# Patient Record
Sex: Female | Born: 1975 | Race: White | Hispanic: No | Marital: Married | Smoking: Current every day smoker
Health system: Southern US, Community
[De-identification: ages and names within clinical notes are randomized; demographics above are authoritative.]

## PROBLEM LIST (undated history)

## (undated) DIAGNOSIS — M549 Dorsalgia, unspecified: Secondary | ICD-10-CM

## (undated) HISTORY — PX: APPENDECTOMY: SHX54

---

## 2007-05-10 ENCOUNTER — Emergency Department (HOSPITAL_COMMUNITY): Admission: EM | Admit: 2007-05-10 | Discharge: 2007-05-10 | Payer: Self-pay | Admitting: Emergency Medicine

## 2007-05-26 ENCOUNTER — Emergency Department (HOSPITAL_COMMUNITY): Admission: EM | Admit: 2007-05-26 | Discharge: 2007-05-26 | Payer: Self-pay | Admitting: Emergency Medicine

## 2007-07-22 ENCOUNTER — Emergency Department (HOSPITAL_COMMUNITY): Admission: EM | Admit: 2007-07-22 | Discharge: 2007-07-22 | Payer: Self-pay | Admitting: Emergency Medicine

## 2007-07-29 ENCOUNTER — Emergency Department (HOSPITAL_COMMUNITY): Admission: EM | Admit: 2007-07-29 | Discharge: 2007-07-29 | Payer: Self-pay | Admitting: Emergency Medicine

## 2007-10-13 ENCOUNTER — Emergency Department (HOSPITAL_COMMUNITY): Admission: EM | Admit: 2007-10-13 | Discharge: 2007-10-13 | Payer: Self-pay | Admitting: Emergency Medicine

## 2007-11-19 ENCOUNTER — Emergency Department (HOSPITAL_COMMUNITY): Admission: EM | Admit: 2007-11-19 | Discharge: 2007-11-19 | Payer: Self-pay | Admitting: Emergency Medicine

## 2010-10-23 ENCOUNTER — Emergency Department (HOSPITAL_COMMUNITY)
Admission: EM | Admit: 2010-10-23 | Discharge: 2010-10-23 | Disposition: A | Discharge status: Home / Self Care | Payer: Self-pay | Attending: Emergency Medicine | Admitting: Emergency Medicine

## 2010-10-23 DIAGNOSIS — Z87440 Personal history of urinary (tract) infections: Secondary | ICD-10-CM | POA: Insufficient documentation

## 2010-10-23 DIAGNOSIS — M545 Low back pain, unspecified: Secondary | ICD-10-CM | POA: Insufficient documentation

## 2010-10-23 DIAGNOSIS — F341 Dysthymic disorder: Secondary | ICD-10-CM | POA: Insufficient documentation

## 2011-01-11 ENCOUNTER — Emergency Department (HOSPITAL_COMMUNITY)
Admission: EM | Admit: 2011-01-11 | Discharge: 2011-01-11 | Disposition: A | Discharge status: Home / Self Care | Payer: Medicaid Other | Attending: Emergency Medicine | Admitting: Emergency Medicine

## 2011-01-11 DIAGNOSIS — H53149 Visual discomfort, unspecified: Secondary | ICD-10-CM | POA: Insufficient documentation

## 2011-01-11 DIAGNOSIS — X58XXXA Exposure to other specified factors, initial encounter: Secondary | ICD-10-CM | POA: Insufficient documentation

## 2011-01-11 DIAGNOSIS — S058X9A Other injuries of unspecified eye and orbit, initial encounter: Secondary | ICD-10-CM | POA: Insufficient documentation

## 2011-03-22 ENCOUNTER — Other Ambulatory Visit: Payer: Self-pay | Admitting: Neurosurgery

## 2011-03-22 DIAGNOSIS — M5126 Other intervertebral disc displacement, lumbar region: Secondary | ICD-10-CM

## 2011-03-25 ENCOUNTER — Ambulatory Visit
Admission: RE | Admit: 2011-03-25 | Discharge: 2011-03-25 | Disposition: A | Discharge status: Home / Self Care | Payer: Medicaid Other | Source: Ambulatory Visit | Attending: Neurosurgery | Admitting: Neurosurgery

## 2011-03-25 DIAGNOSIS — M5126 Other intervertebral disc displacement, lumbar region: Secondary | ICD-10-CM

## 2011-03-25 MED ORDER — METHYLPREDNISOLONE ACETATE 40 MG/ML INJ SUSP (RADIOLOG: 120.0000 mg | Freq: Once | INTRAMUSCULAR | Status: DC

## 2011-03-25 MED ORDER — IOHEXOL 180 MG/ML  SOLN: 1.0000 mL | Freq: Once | INTRAMUSCULAR | Status: AC | PRN

## 2011-03-31 ENCOUNTER — Other Ambulatory Visit: Payer: Self-pay | Admitting: Neurosurgery

## 2011-03-31 DIAGNOSIS — M5126 Other intervertebral disc displacement, lumbar region: Secondary | ICD-10-CM

## 2011-04-07 ENCOUNTER — Other Ambulatory Visit: Payer: Self-pay | Admitting: Neurosurgery

## 2011-04-07 ENCOUNTER — Ambulatory Visit
Admission: RE | Admit: 2011-04-07 | Discharge: 2011-04-07 | Disposition: A | Discharge status: Home / Self Care | Payer: Medicaid Other | Source: Ambulatory Visit | Attending: Neurosurgery | Admitting: Neurosurgery

## 2011-04-07 DIAGNOSIS — M5126 Other intervertebral disc displacement, lumbar region: Secondary | ICD-10-CM

## 2011-04-07 MED ORDER — METHYLPREDNISOLONE ACETATE 40 MG/ML INJ SUSP (RADIOLOG
120.0000 mg | Freq: Once | INTRAMUSCULAR | Status: AC
  Administered 2011-04-07: 120 mg via EPIDURAL

## 2011-04-07 MED ORDER — IOHEXOL 180 MG/ML  SOLN
1.0000 mL | Freq: Once | INTRAMUSCULAR | Status: AC | PRN
  Administered 2011-04-07: 1 mL via EPIDURAL

## 2011-04-07 NOTE — Progress Notes (Signed)
Pt in significant pain from injection. Cold pack to right buttocks for discomfort. Pt unable to sit still and was discharged to home with her pain medication. Dr. Carlota Raspberry in, and aware of her pain

## 2011-04-09 LAB — URINALYSIS, ROUTINE W REFLEX MICROSCOPIC
Bilirubin Urine: NEGATIVE
Glucose, UA: NEGATIVE
Ketones, ur: NEGATIVE
Nitrite: NEGATIVE
Protein, ur: 30 — AB
Specific Gravity, Urine: >1.03 — ABNORMAL HIGH
pH: 5

## 2011-04-09 LAB — URINE CULTURE

## 2011-04-09 LAB — URINE MICROSCOPIC-ADD ON

## 2011-04-11 ENCOUNTER — Emergency Department (HOSPITAL_COMMUNITY)
Admission: EM | Admit: 2011-04-11 | Discharge: 2011-04-11 | Discharge status: Against Medical Advice | Payer: Medicaid Other | Attending: Emergency Medicine | Admitting: Emergency Medicine

## 2011-04-11 DIAGNOSIS — M549 Dorsalgia, unspecified: Secondary | ICD-10-CM | POA: Insufficient documentation

## 2011-04-11 DIAGNOSIS — Z5320 Procedure and treatment not carried out because of patient's decision for unspecified reasons: Secondary | ICD-10-CM | POA: Insufficient documentation

## 2011-04-11 DIAGNOSIS — M79609 Pain in unspecified limb: Secondary | ICD-10-CM | POA: Insufficient documentation

## 2011-04-11 HISTORY — DX: Dorsalgia, unspecified: M54.9

## 2011-04-11 NOTE — ED Notes (Signed)
Pt presents with pain "shooting" down right leg. Pt states she has had chronic back pain and is currently under care of Dr Channing Mutters and states "i think a disc ruptured today at home".

## 2011-12-30 ENCOUNTER — Other Ambulatory Visit: Payer: Self-pay | Admitting: Neurosurgery

## 2011-12-30 DIAGNOSIS — M5126 Other intervertebral disc displacement, lumbar region: Secondary | ICD-10-CM

## 2012-02-19 ENCOUNTER — Other Ambulatory Visit: Payer: Self-pay | Admitting: Neurosurgery

## 2012-02-19 DIAGNOSIS — M5126 Other intervertebral disc displacement, lumbar region: Secondary | ICD-10-CM

## 2012-02-29 ENCOUNTER — Inpatient Hospital Stay: Admission: RE | Admit: 2012-02-29 | Payer: Medicaid Other | Source: Ambulatory Visit

## 2020-01-07 ENCOUNTER — Encounter (HOSPITAL_COMMUNITY): Payer: Self-pay | Admitting: *Deleted

## 2020-01-07 ENCOUNTER — Other Ambulatory Visit: Payer: Self-pay

## 2020-01-07 DIAGNOSIS — G4700 Insomnia, unspecified: Secondary | ICD-10-CM | POA: Insufficient documentation

## 2020-01-07 DIAGNOSIS — F1123 Opioid dependence with withdrawal: Secondary | ICD-10-CM | POA: Diagnosis present

## 2020-01-07 DIAGNOSIS — F419 Anxiety disorder, unspecified: Secondary | ICD-10-CM | POA: Insufficient documentation

## 2020-01-07 DIAGNOSIS — F17200 Nicotine dependence, unspecified, uncomplicated: Secondary | ICD-10-CM | POA: Insufficient documentation

## 2020-01-07 NOTE — ED Triage Notes (Signed)
Pt states that she had stopped using suboxone 27 days until yesterday, today c/o inability to sleep, itching, states that she used yesterday to help sleep because it has been several days since she was able to sleep.

## 2020-01-08 ENCOUNTER — Other Ambulatory Visit: Payer: Self-pay

## 2020-01-08 ENCOUNTER — Ambulatory Visit (HOSPITAL_COMMUNITY)
Admission: EM | Admit: 2020-01-08 | Discharge: 2020-01-08 | Disposition: A | Discharge status: Home / Self Care | Payer: 59 | Attending: Nurse Practitioner | Admitting: Nurse Practitioner

## 2020-01-08 ENCOUNTER — Emergency Department (HOSPITAL_COMMUNITY)
Admission: EM | Admit: 2020-01-08 | Discharge: 2020-01-08 | Disposition: A | Discharge status: Home / Self Care | Payer: 59 | Attending: Emergency Medicine | Admitting: Emergency Medicine

## 2020-01-08 DIAGNOSIS — F331 Major depressive disorder, recurrent, moderate: Secondary | ICD-10-CM

## 2020-01-08 DIAGNOSIS — F411 Generalized anxiety disorder: Secondary | ICD-10-CM | POA: Diagnosis not present

## 2020-01-08 DIAGNOSIS — G4700 Insomnia, unspecified: Secondary | ICD-10-CM

## 2020-01-08 DIAGNOSIS — F419 Anxiety disorder, unspecified: Secondary | ICD-10-CM

## 2020-01-08 DIAGNOSIS — F1193 Opioid use, unspecified with withdrawal: Secondary | ICD-10-CM

## 2020-01-08 MED ORDER — HYDROXYZINE PAMOATE 25 MG PO CAPS: 25.0000 mg | ORAL_CAPSULE | Freq: Three times a day (TID) | ORAL | 0 refills | Status: DC | PRN

## 2020-01-08 MED ORDER — HYDROXYZINE HCL 25 MG PO TABS
50.0000 mg | ORAL_TABLET | Freq: Once | ORAL | Status: AC
  Administered 2020-01-08: 50 mg via ORAL
  Filled 2020-01-08: qty 2

## 2020-01-08 NOTE — BH Assessment (Signed)
Comprehensive Clinical Assessment (CCA) Note  01/08/2020 Catherine Whitehead [id number]  Visit Diagnosis:      ICD-10-CM   1. 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 Anxiety state  F41.1      Catherine Whitehead is a 44 year old female who was voluntarily brought to the [facility] by her husband due to ongoing abuse of Suboxone. Pt shares she was put on Percocet 70m 10 years ago and that she then switched to Suboxone 8 years ago due to the belief that it was less addictive and that it would be easier for her to get off of when she decided she wanted to stop using it. Pt shared she didn't know any information about Suboxone at the time, so she believed the information she was given at the time. Pt states that she got married to a bail bondsman on Dec 07, 2019 and that she told him she had stopped using the substance at the time of their marriage, when, in fact, she had not, so 27 days ago she independently quit using it. She states that for the last 2 weeks she has not been able to sleep, so 3 nights ago she again took the substance just so she could get some rest, but instead of it helping her sleep, it had the opposite effect and made her extremely anxious and she had to go to the [facility] to be given something else to counter-effect the substance. Pt shares she is hoping that she can get something non-addictive to assist in getting her through the next short amount of time to continue to get her off of Suboxone so she doesn't go back to using it, or anything else, again.  Pt denies SI, though she shares she has a hx of SI from 2008 and a hx of anxiety and MDD. She shares she's been on multiple medications to assist with her symptoms but states none have ever helped her; she shares these medications include Paxil, Cymbalta, Lexapro,Xanax, Wellbutrin, Seroquel, and Buspar. She shares that the last 3 medications listed [facility]  one appt with in 2008.  Pt denies she's currently experiencing any depressive symptoms, stating she's currently primarily experiencing the symptoms from detoxing from Suboxone.  Pt is oriented x5. Her recent and remote memory is intact. Pt was cooperative and pleasant throughout the assessment process. Pt's insight, judgement, and impulse control is fair at this time.    CCA Screening, Triage and Referral (STR)  Patient Reported Information [facility]  Referral name: AForestine NaED  Referral phone number: No data recorded  Whom do you see for routine medical problems? I don't have a doctor  Practice/Facility Name: No data recorded Practice/Facility Phone Number: No data recorded Name of Contact: No data recorded Contact Number: No data recorded Contact Fax Number: No data recorded Prescriber Name: No data recorded Prescriber Address (if known): No data recorded  What Is the Reason for Your Visit/Call Today? Pt took herself off of Suboxone 27 days ago and hasn't been able to sleep  How Long Has This Been Causing You Problems? 1 wk - 1 month  What Do You Feel Would Help You the Most Today? Medication   Have You Recently Been in Any Inpatient Treatment (Hospital/Detox/Crisis Center/28-Day Program)? No  Name/Location of Program/Hospital:No data recorded How Long Were You There? No data recorded When Were You Discharged? No data recorded  Have  You Ever Received Services From [facility] Before? No  Who Do You See at [facility]? No data recorded  Have You Recently Had Any Thoughts About Hurting Yourself? No  Are You Planning to Commit Suicide/Harm Yourself At This time? No   Have you Recently Had Thoughts About [place]? No  Explanation: No data recorded  Have You Used Any Alcohol or Drugs in the Past 24 Hours? No  How Long Ago Did You Use Drugs or Alcohol? No data recorded What Did You Use and How Much? No data  recorded  Do You Currently Have a Therapist/Psychiatrist? No  Name of Therapist/Psychiatrist: No data recorded  Have You Been Recently Discharged From Any Office Practice or Programs? No  Explanation of Discharge From Practice/Program: No data recorded    CCA Screening Triage Referral Assessment Type of Contact: Face-to-Face  Is this Initial or Reassessment? No data recorded Date Telepsych consult ordered in [facility]:  No data recorded Time Telepsych consult ordered in [facility]:  No data recorded  Patient Reported Information Reviewed? Yes  Patient Left Without Being Seen? No data recorded Reason for Not Completing Assessment: No data recorded  Collateral Involvement: Pt declined for clinician to speak to her husband   Does Patient Have a Court Appointed Legal Guardian? No data recorded Name and Contact of Legal Guardian: No data recorded If Minor and Not Living with Parent(s), Who has Custody? N/A  Is CPS involved or ever been involved? Never  Is APS involved or ever been involved? Never   Patient Determined To Be At Risk for Harm To Self or Others Based on Review of Patient Reported Information or Presenting Complaint? No data recorded Method: No data recorded Availability of Means: No data recorded Intent: No data recorded Notification Required: No data recorded Additional Information for Danger to Others Potential: No data recorded Additional Comments for Danger to Others Potential: No data recorded Are There Guns or Other Weapons in Your Home? No data recorded Types of Guns/Weapons: No data recorded Are These Weapons Safely Secured?                            No data recorded Who Could Verify You Are Able To Have These Secured: No data recorded Do You Have any Outstanding Charges, Pending Court Dates, Parole/Probation? No data recorded Contacted To Inform of Risk of Harm To Self or Others: No data recorded  Location of Assessment: [facility] Assessment Services   Does  Patient Present under Involuntary Commitment? No  IVC Papers Initial File Date: No data recorded  [place] of Residence: [place]   Patient Currently Receiving the Following Services: Not Receiving Services   Determination of Need: Routine (7 days)   Options For Referral: Medication Management;Outpatient Therapy     CCA Biopsychosocial  Intake/Chief Complaint:  CCA Intake With Chief Complaint CCA Part Two Date: 01/08/20 CCA Part Two Time: 2100 Chief Complaint/Presenting Problem: Pt took herself off of Suboxone 27 days ago and has not been sleeping Patient's Currently Reported Symptoms/Problems: Pt shares she hasn't been sleeping for 2 weeks; took Suboxone in the last 3 days which made things worse. Went to [facility] last night and they referred her here. Individual's Strengths: Pt has a desire to be off of all habit-forming medication. Individual's Preferences: Pt would like to stop taking medication. Individual's Abilities: Pt has the drive and the desire to stop taking medication. Type of Services Patient Feels Are Needed: Pt would  like to see a therapist and possibly a psychiatrist. Initial Clinical Notes/Concerns: Pt has a good support system.  Mental Health Symptoms Depression:  Depression: Fatigue, Sleep (too much or little), Duration of symptoms greater than two weeks  Mania:     Anxiety:   Anxiety: None  Psychosis:  Psychosis: None  Trauma:  Trauma: None  Obsessions:  Obsessions: None  Compulsions:  Compulsions: None  Inattention:  Inattention: None  Hyperactivity/Impulsivity:  Hyperactivity/Impulsivity: N/A  Oppositional/Defiant Behaviors:  Oppositional/Defiant Behaviors: None  Emotional Irregularity:  Emotional Irregularity: None  Other Mood/Personality Symptoms:      Mental Status Exam Appearance and self-care  Stature:  Stature: Average  Weight:  Weight: Average weight  Clothing:  Clothing: Casual  Grooming:  Grooming: Normal  Cosmetic use:  Cosmetic Use: Age  appropriate  Posture/gait:  Posture/Gait: Normal  Motor activity:  Motor Activity: Not Remarkable  Sensorium  Attention:  Attention: Normal  Concentration:  Concentration: Normal  Orientation:  Orientation: X5  Recall/memory:  Recall/Memory: Normal  Affect and Mood  Affect:  Affect: Anxious  Mood:  Mood: Anxious  Relating  Eye contact:  Eye Contact: Normal  Facial expression:  Facial Expression: Responsive  Attitude toward examiner:  Attitude Toward Examiner: Cooperative  Thought and Language  Speech flow: Speech Flow: Clear and Coherent  Thought content:  Thought Content: Appropriate to Mood and Circumstances  Preoccupation:  Preoccupations: None  Hallucinations:  Hallucinations: None  Organization:     Transport planner of Knowledge:  Fund of Knowledge: Good  Intelligence:  Intelligence: Average  Abstraction:  Abstraction: Normal  Judgement:  Judgement: Normal  Reality Testing:     Insight:  Insight: Good  Decision Making:  Decision Making: Normal  Social Functioning  Social Maturity:  Social Maturity: Impulsive  Social Judgement:  Social Judgement: Normal  Stress  Stressors:     Coping Ability:  Coping Ability: Normal  Skill Deficits:  Skill Deficits: None  Supports:  Supports: Family     Religion:    Leisure/Recreation:    Exercise/Diet:     CCA Employment/Education  Employment/Work Situation: Employment / Work Situation Employment situation: Leave of absence Patient's job has been impacted by current illness: Yes Describe how patient's job has been impacted: Pt is currently on a leave of absence from her job until she is stable from her Suboxone detox  Education: Education Is Patient Currently Attending School?: No   CCA Family/Childhood History  Family and Relationship History: Family history Marital status: Married (Patient married someone she's known since high school on Dec 07, 2019) Does patient have children?: Yes How many  children?: 1 How is patient's relationship with their children?: Pt has a positive relationship with her 30 year old son  Childhood History:  Childhood History By whom was/is the patient raised?: Both parents  Child/Adolescent Assessment:     CCA Substance Use  Alcohol/Drug Use:                           ASAM's:  Six Dimensions of Multidimensional Assessment  Dimension 1:  Acute Intoxication and/or Withdrawal Potential:      Dimension 2:  Biomedical Conditions and Complications:      Dimension 3:  Emotional, Behavioral, or Cognitive Conditions and Complications:     Dimension 4:  Readiness to Change:     Dimension 5:  Relapse, Continued use, or Continued Problem Potential:     Dimension 6:  Recovery/Living Environment:     ASAM  Severity Score:    ASAM Recommended Level of Treatment:     Substance use Disorder (SUD)    Recommendations for Services/Supports/Treatments: Lindon Romp, NP, reviewed pt's chart and information and met with pt and determined pt does not meet observation or inpatient criteria. Pt was prescribed non-habit-forming medication to assist with her withdrawal symptoms and to assist with her inability to sleep. She was scheduled with outpatient providers.    DSM5 Diagnoses: There are no problems to display for this patient.   Patient Centered Plan: Patient is on the following Treatment Plan(s):  Substance Abuse   Referrals to Alternative Service(s): Referred to Alternative Service(s):   Place:   Date:   Time:    Referred to Alternative Service(s):   Place:   Date:   Time:    Referred to Alternative Service(s):   Place:   Date:   Time:    Referred to Alternative Service(s):   Place:   Date:   Time:     Dannielle Burn

## 2020-01-08 NOTE — Discharge Instructions (Addendum)
Look at the resource guides, there are several places listed in [place] where he can go to have your anxiety managed and they can also help you with your narcotic addiction.  Return to the emergency department if you feel like you are going to harm yourself or harm somebody else.

## 2020-01-08 NOTE — ED Provider Notes (Addendum)
Behavioral Health Medical Screening Exam  Catherine Whitehead is a 44 y.o. female who presents voluntarily to [facility] due to depression and anxiety. Patient reports that she visited [facility] ED last night and she was referred to [facility]. She denies suicidal thoughts, homicidal thoughts, and auditory visual hallucinations. She states that she is having some anxiety related to no longer using percocet or suboxone. States that she was taking the suboxone "illegally", but has not used in 27 days until recently when she took one suboxone to help with sleep and anxiety.  She denies need for medication assisted therapy r/t to history of substance abuse. . She is interested in medication management for depression and anxiety. PDMP review did not reveal any controlled substance prescriptions.   Total Time spent with patient: 30 minutes  Psychiatric Specialty Exam  Presentation  General Appearance:Casual;Well Groomed  Eye Contact:Good  Speech:Clear and Coherent;Normal Rate  Speech Volume:Normal  Handedness:No data recorded  Mood and Affect  Mood:Anxious;Depressed  Affect:Congruent;Depressed   Thought Process  Thought Processes:Coherent;Goal Directed;Linear  Descriptions of Associations:Intact  Orientation:No data recorded Thought Content:WDL  Hallucinations:None  Ideas of Reference:None  Suicidal Thoughts:No  Homicidal Thoughts:No   Sensorium  Memory:Immediate Good;Recent Good;Remote Good  Judgment:Good  Insight:Fair   Executive Functions  Concentration:Good  Attention Span:Good  Recall:Good  Fund of Knowledge:Good  Language:Good   Psychomotor Activity  Psychomotor Activity:Restlessness   Assets  Assets:Communication Skills;Financial Resources/Insurance;Desire for Improvement;Housing;Intimacy;Leisure Time;Physical Health   Sleep  Sleep:Fair  Number of hours: No data recorded  Physical Exam: Physical Exam Constitutional:      General: She is not in  acute distress.    Appearance: She is not ill-appearing, toxic-appearing or diaphoretic.  Cardiovascular:     Rate and Rhythm: Normal rate.  Pulmonary:     Effort: Pulmonary effort is normal.  Neurological:     Mental Status: She is alert.    Review of Systems  Constitutional: Negative for chills, diaphoresis, fever, malaise/fatigue and weight loss.  Respiratory: Negative for cough and shortness of breath.   Cardiovascular: Negative for chest pain and palpitations.  Gastrointestinal: Negative for diarrhea, nausea and vomiting.  Neurological: Negative for dizziness and seizures.  Psychiatric/Behavioral: Positive for depression and substance abuse. Negative for hallucinations, memory loss and suicidal ideas. The patient is nervous/anxious and has insomnia.   All other systems reviewed and are negative.  Blood pressure (!) 148/80, pulse 98, temperature 97.8 F (36.6 C), resp. rate 18. There is no height or weight on file to calculate BMI.  Musculoskeletal: Strength & Muscle Tone: within normal limits Gait & Station: normal Patient leans: N/A   Recommendations:  Based on my evaluation the patient does not appear to have an emergency medical condition.   Disposition: No evidence of imminent risk to self or others at present.   Patient does not meet criteria for psychiatric inpatient admission. Supportive therapy provided about ongoing stressors. Discussed crisis plan, support from social network, calling 911, coming to the Emergency Department, and calling Suicide Hotline. Appointment scheduled for therapy.   Provided prescription for hydroxyzine 25 mg #30 TID prn for anxiety. Reviewed benefits and adverse effects. Patient in agreement with plan.    Update: Patient's husband returned to [facility] after patient was discharged. He asked to speak with provider about his wife. Staff informed that the patient would need to provide consent. He asked staff to tell this provider that he was  filing an ethics complaint due to provider prescribing hydroxyzine for anxiety. He expressed to staff concern  regarding the patient's history of addiction issues.  Rozetta Nunnery, NP 01/08/2020, 9:26 PM

## 2020-01-08 NOTE — ED Provider Notes (Signed)
[facility] Provider Note   CSN: [id number] Arrival date & time: 01/07/20  2226   Time seen 2:25 AM  History Chief Complaint  Patient presents with  . Withdrawal    Catherine Whitehead is a 44 y.o. female.  HPI   Patient states she has had a narcotic problem for a long time.  She states she was on Percocet 10 mg tablets 10 years ago.  She has been on Suboxone for the past 8 years, however they have not been prescribed by a physician.  She got married and her new husband has helped her stop using his Suboxone which the last time was about 27 days ago however she did take some yesterday "for insomnia".  She states she has gone through the narcotic withdrawal and now she is left with a lots of anxiety and insomnia.  She has taken Tylenol PM, melatonin up to 20 mg and tryptophan without sleep.  She states also after she took the Suboxone she itched all over without a rash.  She states she feels like she has mild depression but she denies suicidal or homicidal ideation and states her main problem is anxiety.  PCP Patient, No Pcp Per   Past Medical History:  Diagnosis Date  . Back pain     There are no problems to display for this patient.   Past Surgical History:  Procedure Laterality Date  . APPENDECTOMY       OB History   No obstetric history on file.     No family history on file.  Social History   Tobacco Use  . Smoking status: Current Every Day Smoker    Packs/day: 1.00  . Smokeless tobacco: Never Used  Substance Use Topics  . Alcohol use: Yes    Comment: occ  . Drug use: No    Home Medications Prior to Admission medications   Not on File    Allergies    Patient has no known allergies.  Review of Systems   Review of Systems  All other systems reviewed and are negative.   Physical Exam Updated Vital Signs BP (!) 135/91   Pulse 86   Temp 98.2 F (36.8 C) (Oral)   Resp 16   Ht 5\' 6"  (1.676 m)   Wt 92.1 kg   LMP  12/07/2019   SpO2 97%   BMI 32.77 kg/m   Physical Exam Vitals and nursing note reviewed.  Constitutional:      General: She is not in acute distress.    Appearance: Normal appearance. She is not ill-appearing.  HENT:     Head: Normocephalic and atraumatic.     Right Ear: External ear normal.     Left Ear: External ear normal.     Nose: Nose normal.     Mouth/Throat:     Mouth: Mucous membranes are moist.     Comments: No tremor of tongue Eyes:     Extraocular Movements: Extraocular movements intact.     Conjunctiva/sclera: Conjunctivae normal.     Pupils: Pupils are equal, round, and reactive to light.  Cardiovascular:     Rate and Rhythm: Normal rate and regular rhythm.     Pulses: Normal pulses.     Heart sounds: No murmur heard.   Pulmonary:     Effort: Pulmonary effort is normal. No respiratory distress.  Musculoskeletal:        General: Normal range of motion.     Cervical back: Normal range of motion.  Skin:    General: Skin is warm and dry.  Neurological:     General: No focal deficit present.     Mental Status: She is alert and oriented to person, place, and time.     Cranial Nerves: No cranial nerve deficit.  Psychiatric:        Mood and Affect: Mood normal.        Behavior: Behavior normal.        Thought Content: Thought content normal.     Comments: Good eye contact.  Very interactive.     ED Results / Procedures / Treatments   Labs (all labs ordered are listed, but only abnormal results are displayed) Labs Reviewed - No data to display  EKG None  Radiology No results found.  Procedures Procedures (including critical care time)  Medications Ordered in ED Medications  hydrOXYzine (ATARAX/VISTARIL) tablet 50 mg (has no administration in time range)    ED Course  I have reviewed the triage vital signs and the nursing notes.  Pertinent labs & imaging results that were available during my care of the patient were reviewed by me and considered  in my medical decision making (see chart for details).    MDM Rules/Calculators/A&P                           Patient states she used to see Dr. Evelene Croon, psychiatrist in the late 1990s.  Patient was given hydroxyzine in the ED.  She was given referrals to get local psychiatric care to help manage her anxiety and hopefully prevent her from going back to using Suboxone again.   Final Clinical Impression(s) / ED Diagnoses Final diagnoses:  Narcotic withdrawal (HCC)  Anxiety  Insomnia, unspecified type    Rx / DC Orders ED Discharge Orders    None     Plan discharge  Devoria Albe, MD, Concha Pyo, MD 01/08/20 [phone]

## 2020-01-09 ENCOUNTER — Ambulatory Visit (HOSPITAL_COMMUNITY): Payer: PRIVATE HEALTH INSURANCE

## 2023-12-16 ENCOUNTER — Other Ambulatory Visit: Payer: Self-pay | Admitting: Internal Medicine

## 2023-12-16 DIAGNOSIS — E278 Other specified disorders of adrenal gland: Secondary | ICD-10-CM

## 2024-01-23 ENCOUNTER — Ambulatory Visit
Admission: RE | Admit: 2024-01-23 | Discharge: 2024-01-23 | Disposition: A | Discharge status: Home / Self Care | Source: Ambulatory Visit | Attending: Internal Medicine | Admitting: Internal Medicine

## 2024-01-23 DIAGNOSIS — E278 Other specified disorders of adrenal gland: Secondary | ICD-10-CM

## 2024-01-23 MED ORDER — GADOPICLENOL 0.5 MMOL/ML IV SOLN
9.0000 mL | Freq: Once | INTRAVENOUS | Status: AC | PRN
  Administered 2024-01-23: 9 mL via INTRAVENOUS

## 2024-08-24 ENCOUNTER — Telehealth: Payer: Self-pay | Admitting: *Deleted

## 2024-08-24 NOTE — Telephone Encounter (Signed)
 Reasons for the colonoscopy: SCREENING  Have you had a colonoscopy before?  NO  Do you have family history of colon cancer? YES, FATHER  Previous colonoscopy with polyps removed? N/A  Do you have a history colorectal cancer?   NO  Are you diabetic? If yes, Type 1 or Type 2?    NO  Do you have a prosthetic or mechanical heart valve? NO  Do you have a pacemaker/defibrillator?   NO  Have you had endocarditis/atrial fibrillation? NO  Have you had joint replacement within the last 12 months?  NO  Do you tend to be constipated or have to use laxatives? NO  Do you have any history of drugs or alcohol?  NO  Do you use supplemental oxygen?  NO  Have you had a stroke or heart attack within the last 6 months? NO  Do you take weight loss medication?  YES  For female patients: have you had a hysterectomy?  NO                                     are you post menopausal?       YES                                            do you still have your menstrual cycle?       Do you take any blood-thinning medications such as: (aspirin, warfarin, Plavix, Aggrenox)  NO  If yes we need the name, milligram, dosage and who is prescribing doctor   Current Outpatient Medications  Medication Sig Dispense Refill   WEGOVY 0.25 MG/0.5ML SOAJ SQ injection Inject 0.5 mg into the skin once a week.     No current facility-administered medications for this visit.    No Known Allergies
